# Patient Record
Sex: Female | Born: 1990
Health system: Southern US, Community
[De-identification: ages and names within clinical notes are randomized; demographics above are authoritative.]

## PROBLEM LIST (undated history)

## (undated) HISTORY — PX: NO PAST SURGERIES: SHX2092

---
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
## 2017-12-20 ENCOUNTER — Emergency Department (HOSPITAL_COMMUNITY)
Admission: EM | Admit: 2017-12-20 | Discharge: 2017-12-20 | Disposition: A | Discharge status: Home / Self Care | Payer: Self-pay | Attending: Emergency Medicine | Admitting: Emergency Medicine

## 2017-12-20 ENCOUNTER — Encounter: Payer: Self-pay | Admitting: Emergency Medicine

## 2017-12-20 ENCOUNTER — Emergency Department (HOSPITAL_COMMUNITY): Payer: Self-pay

## 2017-12-20 DIAGNOSIS — R1013 Epigastric pain: Secondary | ICD-10-CM | POA: Insufficient documentation

## 2017-12-20 LAB — URINALYSIS, ROUTINE W REFLEX MICROSCOPIC
Bilirubin Urine: NEGATIVE
GLUCOSE, UA: NEGATIVE mg/dL
KETONES UR: NEGATIVE mg/dL
Leukocytes, UA: NEGATIVE
Nitrite: NEGATIVE
PH: 8 (ref 5.0–8.0)
Protein, ur: NEGATIVE mg/dL
SPECIFIC GRAVITY, URINE: 1.014 (ref 1.005–1.030)

## 2017-12-20 LAB — CBC
HCT: 40.5 % (ref 36.0–46.0)
Hemoglobin: 13.5 g/dL (ref 12.0–15.0)
MCH: 29 pg (ref 26.0–34.0)
MCHC: 33.3 g/dL (ref 30.0–36.0)
MCV: 87.1 fL (ref 78.0–100.0)
PLATELETS: 231 10*3/uL (ref 150–400)
RBC: 4.65 MIL/uL (ref 3.87–5.11)
RDW: 13 % (ref 11.5–15.5)
WBC: 6.1 10*3/uL (ref 4.0–10.5)

## 2017-12-20 LAB — COMPREHENSIVE METABOLIC PANEL
ALT: 19 U/L (ref 14–54)
AST: 28 U/L (ref 15–41)
Albumin: 4.1 g/dL (ref 3.5–5.0)
Alkaline Phosphatase: 37 U/L — ABNORMAL LOW (ref 38–126)
Anion gap: 7 (ref 5–15)
BUN: 10 mg/dL (ref 6–20)
CHLORIDE: 107 mmol/L (ref 101–111)
CO2: 27 mmol/L (ref 22–32)
CREATININE: 0.84 mg/dL (ref 0.44–1.00)
Calcium: 9.1 mg/dL (ref 8.9–10.3)
GFR calc non Af Amer: >60 mL/min (ref >60)
Glucose, Bld: 89 mg/dL (ref 65–99)
Potassium: 4.1 mmol/L (ref 3.5–5.1)
SODIUM: 141 mmol/L (ref 135–145)
Total Bilirubin: 0.3 mg/dL (ref 0.3–1.2)
Total Protein: 7.9 g/dL (ref 6.5–8.1)

## 2017-12-20 LAB — POC URINE PREG, ED: Preg Test, Ur: NEGATIVE

## 2017-12-20 LAB — LIPASE, BLOOD: LIPASE: 41 U/L (ref 11–51)

## 2017-12-20 MED ORDER — SODIUM CHLORIDE 0.9 % IV BOLUS
1000.0000 mL | Freq: Once | INTRAVENOUS | Status: AC
  Administered 2017-12-20: 1000 mL via INTRAVENOUS

## 2017-12-20 NOTE — ED Provider Notes (Signed)
[facility]-EMERGENCY DEPT Provider Note   CSN: [id number] Arrival date & time: 12/20/17  1150     History   Chief Complaint Chief Complaint  Patient presents with  . Abdominal Pain    HPI Madison Holland is a 27 y.o. female.  HPI 27 year old female who has had epigastric pain intermittently that began on Friday morning when she woke up.  She had eaten hamburger, fries, and milkshake prior to going to bed the night before.  She has had intermittent pain since that time.  She has had some nausea but no vomiting.  Does appear worse with some p.o. intake especially higher fat food.  She was seen yesterday at urgent care and had a urinalysis.  She was told she had a urinary tract infection but has not had her medication filled at this time.  She states that the pain was worse yesterday.  It eased through the evening.  She is not having any significant pain currently.  She has not vomited has been able to take fluids without difficulty.  She is not sexually active and currently menstruating.  She denies any previous sexual activity. No past medical history on file.  There are no active problems to display for this patient.    OB History   None      Home Medications    Prior to Admission medications   Not on File    Family History No family history on file.  Social History Social History   Tobacco Use  . Smoking status: Not on file  Substance Use Topics  . Alcohol use: Not on file  . Drug use: Not on file     Allergies   Patient has no allergy information on record.   Review of Systems Review of Systems  All other systems reviewed and are negative.    Physical Exam Updated Vital Signs BP 113/70 (BP Location: Left Arm)   Pulse 86   Temp 98.1 F (36.7 C) (Oral)   Resp 16   LMP 12/18/2017   SpO2 100%   Physical Exam  Constitutional: She is oriented to person, place, and time. She appears well-developed and well-nourished.  HENT:   Head: Normocephalic and atraumatic.  Mouth/Throat: Oropharynx is clear and moist.  Eyes: Pupils are equal, round, and reactive to light. EOM are normal.  Cardiovascular: Normal rate and regular rhythm.  Abdominal: Soft. Normal appearance, normal aorta and bowel sounds are normal. There is no hepatosplenomegaly or splenomegaly. There is tenderness in the epigastric area. There is no rigidity, no rebound, no tenderness at McBurney's point and negative Murphy's sign.  Neurological: She is alert and oriented to person, place, and time.  Skin: Skin is warm and dry. Capillary refill takes less than 2 seconds.  Psychiatric: She has a normal mood and affect. Her behavior is normal.  Nursing note and vitals reviewed.    ED Treatments / Results  Labs (all labs ordered are listed, but only abnormal results are displayed) Labs Reviewed  COMPREHENSIVE METABOLIC PANEL - Abnormal; Notable for the following components:      Result Value   Alkaline Phosphatase 37 (*)    All other components within normal limits  URINALYSIS, ROUTINE W REFLEX MICROSCOPIC - Abnormal; Notable for the following components:   Hgb urine dipstick SMALL (*)    Bacteria, UA RARE (*)    Squamous Epithelial / LPF 0-5 (*)    All other components within normal limits  LIPASE, BLOOD  CBC  CBC WITH DIFFERENTIAL/PLATELET  POC URINE PREG, ED  POC URINE PREG, ED    EKG None  Radiology No results found.  Procedures Procedures (including critical care time)  Medications Ordered in ED Medications  sodium chloride 0.9 % bolus 1,000 mL (has no administration in time range)     Initial Impression / Assessment and Plan / ED Course  I have reviewed the triage vital signs and the nursing notes.  Pertinent labs & imaging results that were available during my care of the patient were reviewed by me and considered in my medical decision making (see chart for details).     Patient with epigastric pain which worsens with fatty  foods but no vomiting.  Here her labs are normal.  Initially ordered an ultrasound but has been taking some extended period of time.  I discussed alternatives of following up as an outpatient and patient wishes to do this.  She is advised regarding types of food to eat.  She is tolerating liquids without vomiting.  She is not febrile. Final Clinical Impressions(s) / ED Diagnoses   Final diagnoses:  Epigastric pain    ED Discharge Orders    None       Margarita Grizzle, MD 12/20/17 1424

## 2017-12-20 NOTE — ED Triage Notes (Signed)
Pt c/o LUQ pain since Friday morning with nausea and worsening at night. Radiation up through the chest and gets worse with eating. Has GI hx according to pt.

## 2017-12-21 ENCOUNTER — Encounter: Payer: Self-pay | Admitting: Physician Assistant

## 2018-01-01 ENCOUNTER — Ambulatory Visit: Payer: Self-pay | Admitting: Physician Assistant

## 2018-01-01 ENCOUNTER — Encounter: Payer: Self-pay | Admitting: Physician Assistant

## 2018-01-01 VITALS — BP 116/64 | HR 82 | Ht 61.0 in | Wt 154.0 lb

## 2018-01-01 DIAGNOSIS — R1011 Right upper quadrant pain: Secondary | ICD-10-CM

## 2018-01-01 DIAGNOSIS — R1013 Epigastric pain: Secondary | ICD-10-CM

## 2018-01-01 DIAGNOSIS — R110 Nausea: Secondary | ICD-10-CM

## 2018-01-01 NOTE — Patient Instructions (Signed)
If you are age 25 or older, your body mass index should be between 23-30. Your Body mass index is 29.1 kg/m. If this is out of the aforementioned range listed, please consider follow up with your Primary Care Provider.  If you are age 25 or younger, your body mass index should be between 19-25. Your Body mass index is 29.1 kg/m. If this is out of the aformentioned range listed, please consider follow up with your Primary Care Provider.   You have been scheduled for an abdominal ultrasound at [facility] (1st floor of hospital) on 01/07/18 at 10:30 am. Please arrive 15 minutes prior to your appointment for registration. Make certain not to have anything to eat or drink after midnight the day prior to your appointment. Should you need to reschedule your appointment, please contact radiology at [phone]. This test typically takes about 30 minutes to perform.  Stay on low fat diet for a month total, then have fatty foods in moderation. If you have pain again after 1 month, start Prilosec 20 mg daily for two weeks (one box). If continues, please call.  Thank you for choosing me and [facility].   Hyacinth MeekerJennifer Lemmon,  PA-C

## 2018-01-01 NOTE — Progress Notes (Signed)
Reviewed and agree with documentation and assessment and plan. K. Veena Nandigam , MD   

## 2018-01-01 NOTE — Progress Notes (Signed)
Chief Complaint: Epigastric pain  HPI:    Madison Holland is a 27 year old female who presents to clinic today, accompanied by her mother who assists with history, as a referral from Dr. Rosalia Hammersay in the emergency room for epigastric pain.    12/20/17 ED visit with epigastric pain intermittently.  CMP, urinalysis, lipase, CBC and urine pregnancy test were all negative/normal.    Today, explains she has a history of epigastric cramping related to fatty foods that she eats in the past, but most recently on 12/19/17 she developed severe 10/10 epigastric pain which left her in the bed, associated with nausea, and lasted all day and into the next morning when she proceeded to the ER.  Describes eating a very large fatty meal including a cheeseburger, tater tots covered in cheese and alcoholic milk shake the night before.  Also woke up that morning and took a large amount of pain medicine related to abdominal cramps.  Since being seen in the ER patient has been on a low-fat diet and has been able to avoid any epigastric/right upper quadrant pain at all.  She has had no further problems over the past 2 weeks.   Does describe a history of lactose intolerance with loose stools after dairy products.    Denies fever, chills, blood in her stool, melena, weight loss, anorexia, vomiting, continued abdominal pain, change in bowel habits or symptoms that awaken her at night.  Past Medical/Surgical History: None  Allergies: None  Family History  Problem Relation Age of Onset  . Irritable bowel syndrome Mother     Social History   Socioeconomic History  . Marital status: Single    Spouse name: Not on file  . Number of children: Not on file  . Years of education: Not on file  . Highest education level: Not on file  Occupational History  . Occupation: IT sales professionalales Associate  Social Needs  . Financial resource strain: Not on file  . Food insecurity:    Worry: Not on file    Inability: Not on file  . Transportation  needs:    Medical: Not on file    Non-medical: Not on file  Tobacco Use  . Smoking status: Never Smoker  . Smokeless tobacco: Never Used  Substance and Sexual Activity  . Alcohol use: Yes    Comment: Occasional  . Drug use: Never  . Sexual activity: Not on file  Lifestyle  . Physical activity:    Days per week: Not on file    Minutes per session: Not on file  . Stress: Not on file  Relationships  . Social connections:    Talks on phone: Not on file    Gets together: Not on file    Attends religious service: Not on file    Active member of club or organization: Not on file    Attends meetings of clubs or organizations: Not on file    Relationship status: Not on file  . Intimate partner violence:    Fear of current or ex partner: Not on file    Emotionally abused: Not on file    Physically abused: Not on file    Forced sexual activity: Not on file  Other Topics Concern  . Not on file  Social History Narrative  . Not on file    Review of Systems:    Constitutional: No weight loss, fever or chills Skin: No rash Cardiovascular: No chest pain Respiratory: No SOB  Gastrointestinal: See HPI and otherwise  negative Genitourinary: No dysuria  Neurological: No headache, dizziness or syncope Musculoskeletal: No new muscle or joint pain Hematologic: No bleeding  Psychiatric: No history of depression or anxiety   Physical Exam:  Vital signs: BP 116/64   Pulse 82   Ht 5\' 1"  (1.549 m)   Wt 154 lb (69.9 kg)   LMP 12/18/2017   BMI 29.10 kg/m   Constitutional:   Very Pleasant Caucasian female appears to be in NAD, Well developed, Well nourished, alert and cooperative Head:  Normocephalic and atraumatic. Eyes:   PEERL, EOMI. No icterus. Conjunctiva pink. Ears:  Normal auditory acuity. Neck:  Supple Throat: Oral cavity and pharynx without inflammation, swelling or lesion.  Respiratory: Respirations even and unlabored. Lungs clear to auscultation bilaterally.   No wheezes,  crackles, or rhonchi.  Cardiovascular: Normal S1, S2. No MRG. Regular rate and rhythm. No peripheral edema, cyanosis or pallor.  Gastrointestinal:  Soft, nondistended, nontender. No rebound or guarding. Normal bowel sounds. No appreciable masses or hepatomegaly. Rectal:  Not performed.  Msk:  Symmetrical without gross deformities. Without edema, no deformity or joint abnormality.  Neurologic:  Alert and  oriented x4;  grossly normal neurologically.  Skin:   Dry and intact without significant lesions or rashes. Psychiatric:  Demonstrates good judgement and reason without abnormal affect or behaviors.  RELEVANT LABS AND IMAGING: CBC    Component Value Date/Time   WBC 6.1 12/20/2017 1214   RBC 4.65 12/20/2017 1214   HGB 13.5 12/20/2017 1214   HCT 40.5 12/20/2017 1214   PLT 231 12/20/2017 1214   MCV 87.1 12/20/2017 1214   MCH 29.0 12/20/2017 1214   MCHC 33.3 12/20/2017 1214   RDW 13.0 12/20/2017 1214    CMP     Component Value Date/Time   NA 141 12/20/2017 1214   K 4.1 12/20/2017 1214   CL 107 12/20/2017 1214   CO2 27 12/20/2017 1214   GLUCOSE 89 12/20/2017 1214   BUN 10 12/20/2017 1214   CREATININE 0.84 12/20/2017 1214   CALCIUM 9.1 12/20/2017 1214   PROT 7.9 12/20/2017 1214   ALBUMIN 4.1 12/20/2017 1214   AST 28 12/20/2017 1214   ALT 19 12/20/2017 1214   ALKPHOS 37 (L) 12/20/2017 1214   BILITOT 0.3 12/20/2017 1214   GFRNONAA >60 12/20/2017 1214   GFRAA >60 12/20/2017 1214    Assessment: 1.  Epigastric/right upper quadrant pain: Seen in ER, normal CBC, CMP and lipase, typically after high fat meal; consider gastritis vs gallbladder etiology 2.  Nausea: With above  Plan: 1.  Ordered right upper quadrant ultrasound to consider gallbladder etiology.  Discussed with patient that if this is normal there is no need for further workup at this time if she is not having any more symptoms.  If this does show gallstones we will discuss. 2.  Recommend patient remain on a low-fat  diet over the next month.  She may then trial a small fatty meal to see if she can handle this.  Would recommend she eat fatty meals in moderation in the future.  If she has further abdominal pain over that point would recommend she start Prilosec 20 mg daily times 2 weeks OTC.  If after that she continues with further pain would recommend she call her clinic. 3.  Reviewed antireflux diet and lifestyle modifications. 4.  Patient to follow in clinic as needed with me in the future, she was assigned to Dr. Lavon Paganini today.  Hyacinth Meeker, PA-C [facility] 01/01/2018, 2:40 PM

## 2018-01-07 ENCOUNTER — Ambulatory Visit (HOSPITAL_COMMUNITY)
Admission: RE | Admit: 2018-01-07 | Discharge: 2018-01-07 | Disposition: A | Discharge status: Home / Self Care | Payer: Self-pay | Source: Ambulatory Visit | Attending: Physician Assistant | Admitting: Physician Assistant

## 2018-01-07 DIAGNOSIS — R110 Nausea: Secondary | ICD-10-CM

## 2018-01-07 DIAGNOSIS — R1011 Right upper quadrant pain: Secondary | ICD-10-CM

## 2018-01-07 DIAGNOSIS — K8020 Calculus of gallbladder without cholecystitis without obstruction: Secondary | ICD-10-CM | POA: Insufficient documentation

## 2019-02-03 ENCOUNTER — Other Ambulatory Visit: Payer: Self-pay

## 2019-02-03 ENCOUNTER — Ambulatory Visit (INDEPENDENT_AMBULATORY_CARE_PROVIDER_SITE_OTHER): Payer: 59 | Admitting: Gastroenterology

## 2019-02-03 ENCOUNTER — Encounter: Payer: Self-pay | Admitting: Gastroenterology

## 2019-02-03 VITALS — Ht 61.0 in | Wt 154.0 lb

## 2019-02-03 DIAGNOSIS — R110 Nausea: Secondary | ICD-10-CM

## 2019-02-03 DIAGNOSIS — R140 Abdominal distension (gaseous): Secondary | ICD-10-CM

## 2019-02-03 DIAGNOSIS — R1011 Right upper quadrant pain: Secondary | ICD-10-CM

## 2019-02-03 MED ORDER — OMEPRAZOLE 20 MG PO CPDR: 20.0000 mg | DELAYED_RELEASE_CAPSULE | Freq: Every day | ORAL | 3 refills | Status: DC

## 2019-02-03 NOTE — Patient Instructions (Addendum)
HIDA scan,  You have been scheduled for a HIDA scan at [facility] (1st floor) on 02/07/2019. Please arrive 15 minutes prior to your scheduled appointment at  9:60AV. Make certain not to have anything to eat or drink at least after midnight prior to your test. Should this appointment date or time not work well for you, please call radiology scheduling at [phone].  _____________________________________________________________________ hepatobiliary (HIDA) scan is an imaging procedure used to diagnose problems in the liver, gallbladder and bile ducts. In the HIDA scan, a radioactive chemical or tracer is injected into a vein in your arm. The tracer is handled by the liver like bile. Bile is a fluid produced and excreted by your liver that helps your digestive system break down fats in the foods you eat. Bile is stored in your gallbladder and the gallbladder releases the bile when you eat a meal. A special nuclear medicine scanner (gamma camera) tracks the flow of the tracer from your liver into your gallbladder and small intestine.  During your HIDA scan  You'll be asked to change into a hospital gown before your HIDA scan begins. Your health care team will position you on a table, usually on your back. The radioactive tracer is then injected into a vein in your arm.The tracer travels through your bloodstream to your liver, where it's taken up by the bile-producing cells. The radioactive tracer travels with the bile from your liver into your gallbladder and through your bile ducts to your small intestine.You may feel some pressure while the radioactive tracer is injected into your vein. As you lie on the table, a special gamma camera is positioned over your abdomen taking pictures of the tracer as it moves through your body. The gamma camera takes pictures continually for about an hour. You'll need to keep still during the HIDA scan. This can become uncomfortable, but you may find that you can lessen  the discomfort by taking deep breaths and thinking about other things. Tell your health care team if you're uncomfortable. The radiologist will watch on a computer the progress of the radioactive tracer through your body. The HIDA scan may be stopped when the radioactive tracer is seen in the gallbladder and enters your small intestine. This typically takes about an hour. In some cases extra imaging will be performed if original images aren't satisfactory, if morphine is given to help visualize the gallbladder or if the medication CCK is given to look at the contraction of the gallbladder. This test typically takes 2 hours to complete. ________________________________________________________________________  Omeprazole  daily, 30 minutes before breakfast  Anti reflux measures  Follow up visit virtual in 2-4 weeks   Gastroesophageal Reflux Disease, Adult Gastroesophageal reflux (GER) happens when acid from the stomach flows up into the tube that connects the mouth and the stomach (esophagus). Normally, food travels down the esophagus and stays in the stomach to be digested. However, when a person has GER, food and stomach acid sometimes move back up into the esophagus. If this becomes a more serious problem, the person may be diagnosed with a disease called gastroesophageal reflux disease (GERD). GERD occurs when the reflux:  Happens often.  Causes frequent or severe symptoms.  Causes problems such as damage to the esophagus. When stomach acid comes in contact with the esophagus, the acid may cause soreness (inflammation) in the esophagus. Over time, GERD may create small holes (ulcers) in the lining of the esophagus. What are the causes? This condition is caused by a  problem with the muscle between the esophagus and the stomach (lower esophageal sphincter, or LES). Normally, the LES muscle closes after food passes through the esophagus to the stomach. When the LES is weakened or abnormal, it  does not close properly, and that allows food and stomach acid to go back up into the esophagus. The LES can be weakened by certain dietary substances, medicines, and medical conditions, including:  Tobacco use.  Pregnancy.  Having a hiatal hernia.  Alcohol use.  Certain foods and beverages, such as coffee, chocolate, onions, and peppermint. What increases the risk? You are more likely to develop this condition if you:  Have an increased body weight.  Have a connective tissue disorder.  Use NSAID medicines. What are the signs or symptoms? Symptoms of this condition include:  Heartburn.  Difficult or painful swallowing.  The feeling of having a lump in the throat.  Abitter taste in the mouth.  Bad breath.  Having a large amount of saliva.  Having an upset or bloated stomach.  Belching.  Chest pain. Different conditions can cause chest pain. Make sure you see your health care provider if you experience chest pain.  Shortness of breath or wheezing.  Ongoing (chronic) cough or a night-time cough.  Wearing away of tooth enamel.  Weight loss. How is this diagnosed? Your health care provider will take a medical history and perform a physical exam. To determine if you have mild or severe GERD, your health care provider may also monitor how you respond to treatment. You may also have tests, including:  A test to examine your stomach and esophagus with a small camera (endoscopy).  A test thatmeasures the acidity level in your esophagus.  A test thatmeasures how much pressure is on your esophagus.  A barium swallow or modified barium swallow test to show the shape, size, and functioning of your esophagus. How is this treated? The goal of treatment is to help relieve your symptoms and to prevent complications. Treatment for this condition may vary depending on how severe your symptoms are. Your health care provider may recommend:  Changes to your  diet.  Medicine.  Surgery. Follow these instructions at home: Eating and drinking   Follow a diet as recommended by your health care provider. This may involve avoiding foods and drinks such as: ? Coffee and tea (with or without caffeine). ? Drinks that containalcohol. ? Energy drinks and sports drinks. ? Carbonated drinks or sodas. ? Chocolate and cocoa. ? Peppermint and mint flavorings. ? Garlic and onions. ? Horseradish. ? Spicy and acidic foods, including peppers, chili powder, curry powder, vinegar, hot sauces, and barbecue sauce. ? Citrus fruit juices and citrus fruits, such as oranges, lemons, and limes. ? Tomato-based foods, such as red sauce, chili, salsa, and pizza with red sauce. ? Fried and fatty foods, such as donuts, french fries, potato chips, and high-fat dressings. ? High-fat meats, such as hot dogs and fatty cuts of red and white meats, such as rib eye steak, sausage, ham, and bacon. ? High-fat dairy items, such as whole milk, butter, and cream cheese.  Eat small, frequent meals instead of large meals.  Avoid drinking large amounts of liquid with your meals.  Avoid eating meals during the 2-3 hours before bedtime.  Avoid lying down right after you eat.  Do not exercise right after you eat. Lifestyle   Do not use any products that contain nicotine or tobacco, such as cigarettes, e-cigarettes, and chewing tobacco. If you need help  quitting, ask your health care provider.  Try to reduce your stress by using methods such as yoga or meditation. If you need help reducing stress, ask your health care provider.  If you are overweight, reduce your weight to an amount that is healthy for you. Ask your health care provider for guidance about a safe weight loss goal. General instructions  Pay attention to any changes in your symptoms.  Take over-the-counter and prescription medicines only as told by your health care provider. Do not take aspirin, ibuprofen, or  other NSAIDs unless your health care provider told you to do so.  Wear loose-fitting clothing. Do not wear anything tight around your waist that causes pressure on your abdomen.  Raise (elevate) the head of your bed about 6 inches (15 cm).  Avoid bending over if this makes your symptoms worse.  Keep all follow-up visits as told by your health care provider. This is important. Contact a health care provider if:  You have: ? New symptoms. ? Unexplained weight loss. ? Difficulty swallowing or it hurts to swallow. ? Wheezing or a persistent cough. ? A hoarse voice.  Your symptoms do not improve with treatment. Get help right away if you:  Have pain in your arms, neck, jaw, teeth, or back.  Feel sweaty, dizzy, or light-headed.  Have chest pain or shortness of breath.  Vomit and your vomit looks like blood or coffee grounds.  Faint.  Have stool that is bloody or black.  Cannot swallow, drink, or eat. Summary  Gastroesophageal reflux happens when acid from the stomach flows up into the esophagus. GERD is a disease in which the reflux happens often, causes frequent or severe symptoms, or causes problems such as damage to the esophagus.  Treatment for this condition may vary depending on how severe your symptoms are. Your health care provider may recommend diet and lifestyle changes, medicine, or surgery.  Contact a health care provider if you have new or worsening symptoms.  Take over-the-counter and prescription medicines only as told by your health care provider. Do not take aspirin, ibuprofen, or other NSAIDs unless your health care provider told you to do so.  Keep all follow-up visits as told by your health care provider. This is important. This information is not intended to replace advice given to you by your health care provider. Make sure you discuss any questions you have with your health care provider. Document Released: 06/18/2005 Document Revised: 03/17/2018  Document Reviewed: 03/17/2018 Elsevier Interactive Patient Education  2019 [employer].   I appreciate the  opportunity to care for you  Thank You   Marsa Aris , MD

## 2019-02-03 NOTE — Progress Notes (Signed)
Madison Holland    [id number]    07/24/1991  Primary Care Physician:Patient, No Pcp Per  Referring Physician: No referring provider defined for this encounter.  This service was provided via audio and video telemedicine (Doximity) due to COVID 19 pandemic.  Patient location: Home Provider location: Office Used 2 patient identifiers to confirm the correct person. Explained the limitations in evaluation and management via telemedicine. Patient is aware of potential medical charges for this visit.  Patient consented to this virtual visit.  The persons participating in this telemedicine service were myself and the patient   Chief complaint: Nausea, abdominal bloating, upper abdominal discomfort HPI:  28 year old female with complaints of intermittent nausea and epigastric abdominal discomfort.  She saw Salome ArntJennifer lemon in April 2019. Right upper quadrant abdominal ultrasound January 07, 2018 showed cholelithiasis with largest stone 1.2 cm, no gallbladder wall thickening or pericholecystic fluid.  Normal CBD with no intra-or extrahepatic biliary duct dilation. Has been having nausea almost every day for past few weeks associated with bloating and epigastric abdominal discomfort, worse after meals sometimes. Her bowel habits are slightly worse, irregular with occasional constipation.  Most days has daily bowel movement.  No blood per rectum.  No loss of appetite or weight loss.   No outpatient encounter medications on file as of 02/03/2019.   No facility-administered encounter medications on file as of 02/03/2019.     Allergies as of 02/03/2019  . (Not on File)    History reviewed. No pertinent past medical history.  History reviewed. No pertinent surgical history.  Family History  Problem Relation Age of Onset  . Irritable bowel syndrome Mother     Social History   Socioeconomic History  . Marital status: Single    Spouse name: Not on file  . Number of  children: Not on file  . Years of education: Not on file  . Highest education level: Not on file  Occupational History  . Occupation: IT sales professionalales Associate  Social Needs  . Financial resource strain: Not on file  . Food insecurity:    Worry: Not on file    Inability: Not on file  . Transportation needs:    Medical: Not on file    Non-medical: Not on file  Tobacco Use  . Smoking status: Never Smoker  . Smokeless tobacco: Never Used  Substance and Sexual Activity  . Alcohol use: Yes    Comment: Occasional  . Drug use: Never  . Sexual activity: Not on file  Lifestyle  . Physical activity:    Days per week: Not on file    Minutes per session: Not on file  . Stress: Not on file  Relationships  . Social connections:    Talks on phone: Not on file    Gets together: Not on file    Attends religious service: Not on file    Active member of club or organization: Not on file    Attends meetings of clubs or organizations: Not on file    Relationship status: Not on file  . Intimate partner violence:    Fear of current or ex partner: Not on file    Emotionally abused: Not on file    Physically abused: Not on file    Forced sexual activity: Not on file  Other Topics Concern  . Not on file  Social History Narrative  . Not on file      Review of systems: Review of  Systems as per HPI All other systems reviewed and are negative.   Physical Exam: Vitals were not taken and physical exam was not performed during this virtual visit.  Data Reviewed:  Reviewed labs, radiology imaging, old records and pertinent past GI work up   Assessment and Plan/Recommendations: 28 year old female with complaints of epigastric abdominal discomfort, nausea and bloating. Had cholelithiasis with largest stone 1.2 cm in size based on ultrasound April 2019 Her symptoms are concerning for gallbladder disease, check HIDA scan  Start omeprazole 20 mg daily, 30 minutes before breakfast for possible GERD  exacerbating her symptoms Anti-reflux measures  Follow-up telemedicine visit in 2 to 4 weeks   K. Scherry Ran , MD   CC: No ref. provider found

## 2019-02-07 ENCOUNTER — Other Ambulatory Visit: Payer: Self-pay

## 2019-02-07 ENCOUNTER — Ambulatory Visit (HOSPITAL_COMMUNITY)
Admission: RE | Admit: 2019-02-07 | Discharge: 2019-02-07 | Disposition: A | Discharge status: Home / Self Care | Payer: BLUE CROSS/BLUE SHIELD | Source: Ambulatory Visit | Attending: Gastroenterology | Admitting: Gastroenterology

## 2019-02-07 DIAGNOSIS — R140 Abdominal distension (gaseous): Secondary | ICD-10-CM

## 2019-02-07 DIAGNOSIS — R110 Nausea: Secondary | ICD-10-CM | POA: Insufficient documentation

## 2019-02-07 DIAGNOSIS — R1011 Right upper quadrant pain: Secondary | ICD-10-CM | POA: Insufficient documentation

## 2019-02-07 DIAGNOSIS — R109 Unspecified abdominal pain: Secondary | ICD-10-CM | POA: Diagnosis not present

## 2019-02-07 MED ORDER — TECHNETIUM TC 99M MEBROFENIN IV KIT
5.0000 | PACK | Freq: Once | INTRAVENOUS | Status: AC | PRN
  Administered 2019-02-07: 5 via INTRAVENOUS

## 2019-03-14 ENCOUNTER — Ambulatory Visit (INDEPENDENT_AMBULATORY_CARE_PROVIDER_SITE_OTHER): Payer: Self-pay | Admitting: Gastroenterology

## 2019-03-14 DIAGNOSIS — R1013 Epigastric pain: Secondary | ICD-10-CM

## 2019-03-14 NOTE — Progress Notes (Signed)
Patient said she is feeling well, does not recall making an appointment and did not feel she needs a visit at this point.  She will call if needed.

## 2019-08-06 ENCOUNTER — Encounter (HOSPITAL_COMMUNITY): Payer: Self-pay

## 2019-08-06 ENCOUNTER — Ambulatory Visit (HOSPITAL_COMMUNITY)
Admission: EM | Admit: 2019-08-06 | Discharge: 2019-08-06 | Disposition: A | Discharge status: Home / Self Care | Payer: 59 | Attending: Internal Medicine | Admitting: Internal Medicine

## 2019-08-06 ENCOUNTER — Other Ambulatory Visit: Payer: Self-pay

## 2019-08-06 DIAGNOSIS — J040 Acute laryngitis: Secondary | ICD-10-CM

## 2019-08-06 NOTE — ED Provider Notes (Signed)
[place]    CSN: [id number] Arrival date & time: 08/06/19  1337      History   Chief Complaint Chief Complaint  Patient presents with  . Laryngitis  . Fatigue    HPI Madison Holland is a 28 y.o. female.   28 year old female with no chronic medical problems presents to urgent care complaining of laryngitis x2 days.  Patient denies fever, sore throat, nausea, vomiting, diarrhea or reflux.  She also denies headache or loss of taste or smell.     History reviewed. No pertinent past medical history.  There are no active problems to display for this patient.   History reviewed. No pertinent surgical history.  OB History   No obstetric history on file.      Home Medications    Prior to Admission medications   Medication Sig Start Date End Date Taking? Authorizing Provider  omeprazole (PRILOSEC) 20 MG capsule Take 1 capsule (20 mg total) by mouth daily. 02/03/19   Mauri Pole, MD    Family History Family History  Problem Relation Age of Onset  . Irritable bowel syndrome Mother     Social History Social History   Tobacco Use  . Smoking status: Never Smoker  . Smokeless tobacco: Never Used  Substance Use Topics  . Alcohol use: Yes    Comment: Occasional  . Drug use: Never     Allergies   Patient has no known allergies.   Review of Systems Review of Systems  Constitutional: Negative for chills and fever.  HENT: Negative for sore throat and tinnitus.   Eyes: Negative for redness.  Respiratory: Negative for cough and shortness of breath.   Cardiovascular: Negative for chest pain and palpitations.  Gastrointestinal: Negative for abdominal pain, diarrhea, nausea and vomiting.  Genitourinary: Negative for dysuria, frequency and urgency.  Musculoskeletal: Negative for myalgias.  Skin: Negative for rash.       No lesions  Neurological: Negative for weakness.  Hematological: Does not bruise/bleed easily.  Psychiatric/Behavioral:  Negative for suicidal ideas.     Physical Exam Triage Vital Signs ED Triage Vitals [08/06/19 1404]  Enc Vitals Group     BP (!) 115/50     Pulse Rate 70     Resp 16     Temp 98.2 F (36.8 C)     Temp Source Oral     SpO2 100 %     Weight      Height      Head Circumference      Peak Flow      Pain Score 0     Pain Loc      Pain Edu?      Excl. in [place]?    No data found.  Updated Vital Signs BP (!) 115/50 (BP Location: Left Arm)   Pulse 70   Temp 98.2 F (36.8 C) (Oral)   Resp 16   LMP 07/09/2019   SpO2 100%   Visual Acuity Right Eye Distance:   Left Eye Distance:   Bilateral Distance:    Right Eye Near:   Left Eye Near:    Bilateral Near:     Physical Exam Vitals signs and nursing note reviewed.  Constitutional:      General: She is not in acute distress.    Appearance: She is well-developed.  HENT:     Head: Normocephalic and atraumatic.     Mouth/Throat:     Comments: Mild cobblestoning posterior oropharynx Eyes:  General: No scleral icterus.    Conjunctiva/sclera: Conjunctivae normal.     Pupils: Pupils are equal, round, and reactive to light.  Neck:     Musculoskeletal: Normal range of motion and neck supple.     Thyroid: No thyromegaly.     Vascular: No JVD.     Trachea: No tracheal deviation.  Cardiovascular:     Rate and Rhythm: Normal rate and regular rhythm.     Heart sounds: Normal heart sounds. No murmur. No friction rub. No gallop.   Pulmonary:     Effort: Pulmonary effort is normal.     Breath sounds: Normal breath sounds.  Abdominal:     General: Bowel sounds are normal. There is no distension.     Palpations: Abdomen is soft.     Tenderness: There is no abdominal tenderness.  Musculoskeletal: Normal range of motion.  Lymphadenopathy:     Cervical: No cervical adenopathy.  Skin:    General: Skin is warm and dry.  Neurological:     Mental Status: She is alert and oriented to person, place, and time.     Cranial Nerves: No  cranial nerve deficit.  Psychiatric:        Behavior: Behavior normal.        Thought Content: Thought content normal.        Judgment: Judgment normal.      UC Treatments / Results  Labs (all labs ordered are listed, but only abnormal results are displayed) Labs Reviewed - No data to display  EKG   Radiology No results found.  Procedures Procedures (including critical care time)  Medications Ordered in UC Medications - No data to display  Initial Impression / Assessment and Plan / UC Course  I have reviewed the triage vital signs and the nursing notes.  Pertinent labs & imaging results that were available during my care of the patient were reviewed by me and considered in my medical decision making (see chart for details).     Long discussion with patient regarding bacterial versus viral etiology of laryngitis.  Also discussed risk factors such as vocal straining, smoking, reflux.  The patient does not endorse any of these issues.  She would rather not take antibiotics.  Thus recommended vocal rest. Final Clinical Impressions(s) / UC Diagnoses   Final diagnoses:  Laryngitis   Discharge Instructions   None    ED Prescriptions    None     PDMP not reviewed this encounter.   Arnaldo Natal, MD 08/06/19 [id number]

## 2019-08-06 NOTE — ED Triage Notes (Addendum)
Pt  Present loss voice and fatigue. Symptoms started two days ago.

## 2019-08-09 DIAGNOSIS — Z20828 Contact with and (suspected) exposure to other viral communicable diseases: Secondary | ICD-10-CM | POA: Diagnosis not present

## 2020-10-28 IMAGING — NM NUCLEAR MEDICINE HEPATOBILIARY IMAGING WITH GALLBLADDER EF
2 series · 12 of 12 positions shown · non-contrast
Comparison: Ultrasound right upper quadrant January 07, 2018

CLINICAL DATA: Abdominal pain and nausea. Cholelithiasis on prior
ultrasound

EXAM:
NUCLEAR MEDICINE HEPATOBILIARY IMAGING WITH GALLBLADDER EF
VIEWS:
Anterior right upper quadrant
RADIOPHARMACEUTICALS:  5.0 mCi Xc-RRm  Choletec IV

[he hepatobiliary · 4.52mm/px · 6 of 53 frames shown (1 of 2)]
[frame 5/53]
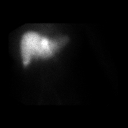
[frame 13/53]
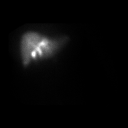
[frame 22/53]
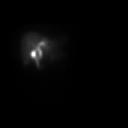
[frame 31/53]
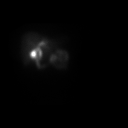
[frame 40/53]
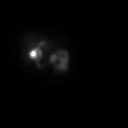
[frame 49/53]
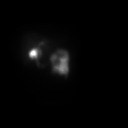

[he hepatobiliary · 4.52mm/px · 6 of 60 frames shown (2 of 2)]
[frame 6/60]
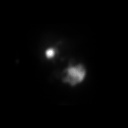
[frame 16/60]
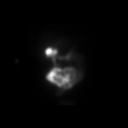
[frame 26/60]
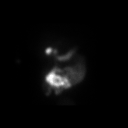
[frame 36/60]
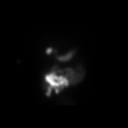
[frame 46/60]
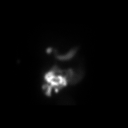
[frame 56/60]
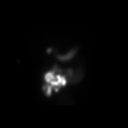

[12 of 12 positions shown; findings below may reference images not displayed]

FINDINGS: Liver uptake of radiotracer is unremarkable. There is prompt
visualization of gallbladder and small bowel, indicating patency of
the cystic and common bile ducts. The patient consumed 8 ounces of
Ensure Complete orally with calculation of the computer generated
ejection fraction of radiotracer from the gallbladder. The patient
did not experience clinical symptoms with the oral Ensure
consumption. The computer generated ejection fraction of radiotracer
from the gallbladder is normal at 89%, normal greater than 33% using
the oral agent.
IMPRESSION: Study within normal limits.

## 2021-01-28 ENCOUNTER — Encounter: Payer: Self-pay | Admitting: Physician Assistant

## 2021-01-28 ENCOUNTER — Ambulatory Visit: Payer: Self-pay | Admitting: Physician Assistant

## 2021-01-28 VITALS — BP 110/70 | HR 93 | Ht 61.0 in | Wt 166.0 lb

## 2021-01-28 DIAGNOSIS — K5900 Constipation, unspecified: Secondary | ICD-10-CM

## 2021-01-28 DIAGNOSIS — A041 Enterotoxigenic Escherichia coli infection: Secondary | ICD-10-CM

## 2021-01-28 NOTE — Patient Instructions (Signed)
If you are age 28 or older, your body mass index should be between 23-30. Your Body mass index is 31.37 kg/m. If this is out of the aforementioned range listed, please consider follow up with your Primary Care Provider.  If you are age 62 or younger, your body mass index should be between 19-25. Your Body mass index is 31.37 kg/m. If this is out of the aformentioned range listed, please consider follow up with your Primary Care Provider.   Please start Miralax once daily for 2 weeks.  Start Align once daily for 2 weeks.  Thank you for choosing me and [facility].  Hyacinth Meeker, PA-C

## 2021-01-28 NOTE — Progress Notes (Signed)
Chief Complaint: Constipation and diarrhea  HPI:    [name] is a 30 year old female with a past medical history as listed below, known to Dr. Lavon Paganini, who presents clinic today with a complaint of constipation and diarrhea.    02/03/2019 patient seen via telemedicine by Dr. Lavon Paganini for nausea, abdominal bloating upper abdominal discomfort.  That time he been having nausea almost every day for a few weeks with bloating and epigastric abdominal discomfort worse after meals.  It was noted that she had an ultrasound previously with cholelithiasis large stone 1.2 cm in size in April 2019.  HIDA scan was ordered and she was started on omeprazole 20 mg daily.    02/07/2019 HIDA scan within normal limits.    01/11/2021 GI pathogen panel with enteroaggregate today of E. coli.  And C. difficile toxin negative.    Today, the patient tells me that a couple of weeks ago she was diagnosed with E. coli.  Around that time had terrible diarrhea and could not eat or drink anything.  Tells me her resting heart rate went up to 98 and she ran a fever for 2 days.  This has since resolved over the past 2 weeks but now she is experiencing constipation, telling me she only has a bowel movement once every 3 days and is very bloated and uncomfortable.  She was using a probiotic during time of diarrhea but has since stopped this.  Has not tried any laxatives.  Prior to her E. coli she was having a bowel movement every day.    Denies continued fever or chills, blood in her stool or symptoms that awaken her from sleep.  Past medical/ Past Surgical History:  Procedure Laterality Date  . NO PAST SURGERIES      No current outpatient medications on file.   No current facility-administered medications for this visit.    Allergies as of 01/28/2021  . (No Known Allergies)    Family History  Problem Relation Age of Onset  . Irritable bowel syndrome Mother     Social History   Socioeconomic History  . Marital  status: Single    Spouse name: Not on file  . Number of children: Not on file  . Years of education: Not on file  . Highest education level: Not on file  Occupational History  . Occupation: IT sales professional  Tobacco Use  . Smoking status: Never Smoker  . Smokeless tobacco: Never Used  Vaping Use  . Vaping Use: Some days  Substance and Sexual Activity  . Alcohol use: Yes    Comment: Occasional  . Drug use: Never  . Sexual activity: Not on file  Other Topics Concern  . Not on file  Social History Narrative  . Not on file   Social Determinants of Health   Financial Resource Strain: Not on file  Food Insecurity: Not on file  Transportation Needs: Not on file  Physical Activity: Not on file  Stress: Not on file  Social Connections: Not on file  Intimate Partner Violence: Not on file    Review of Systems:    Constitutional: No weight loss Cardiovascular: No chest pain Respiratory: No SOB  Gastrointestinal: See HPI and otherwise negative   Physical Exam:  Vital signs: BP 110/70   Pulse 93   Ht 5\' 1"  (1.549 m)   Wt 166 lb (75.3 kg)   BMI 31.37 kg/m   Constitutional:   Pleasant overweight Caucasian female appears to be in NAD, Well developed, Well  nourished, alert and cooperative Respiratory: Respirations even and unlabored. Lungs clear to auscultation bilaterally.   No wheezes, crackles, or rhonchi.  Cardiovascular: Normal S1, S2. No MRG. Regular rate and rhythm. No peripheral edema, cyanosis or pallor.  Gastrointestinal:  Soft, nondistended, nontender. No rebound or guarding. Normal bowel sounds. No appreciable masses or hepatomegaly. Rectal:  Not performed.  Psychiatric: Demonstrates good judgement and reason without abnormal affect or behaviors.  See HPI for recent labs and imaging.  Assessment: 1.  E. coli diarrhea: Diagnosed on 4/22, no further diarrhea per the patient now 2.  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  Discussed  postinfectious IBS.  Likely this is what she is experiencing now. 2.  Recommend the patient start MiraLAX once daily for the next couple of weeks and then back off to see how she is doing. 3.  Encouraged the patient to get over-the-counter Align probiotic and use this once daily for the next 2 months 4.  Patient to follow in clinic with [place] as needed in the future.  Hyacinth Meeker, PA-C [facility] 01/28/2021, 9:05 AM

## 2021-02-08 NOTE — Progress Notes (Signed)
Reviewed and agree with documentation and assessment and plan. K. Veena Kahliyah Dick , MD   

## 2021-02-10 ENCOUNTER — Telehealth: Payer: Self-pay | Admitting: Family

## 2021-02-10 DIAGNOSIS — R399 Unspecified symptoms and signs involving the genitourinary system: Secondary | ICD-10-CM

## 2021-02-10 MED ORDER — CEPHALEXIN 500 MG PO CAPS: 500.0000 mg | ORAL_CAPSULE | Freq: Two times a day (BID) | ORAL | 0 refills | Status: AC

## 2021-02-10 NOTE — Progress Notes (Signed)

## 2021-02-21 ENCOUNTER — Telehealth: Payer: Self-pay | Admitting: Physician Assistant

## 2021-02-21 DIAGNOSIS — N390 Urinary tract infection, site not specified: Secondary | ICD-10-CM

## 2021-02-21 NOTE — Progress Notes (Signed)
Based on what you shared with me, I feel your condition warrants further evaluation and I recommend that you be seen in a face to face office visit. Giving recurrent UTI after recent treatment via e-visti, you need an in-person evaluation so that your urine can be tested and sent for culture. This was we can make sure the infection is properly treated and will not continue to recur.    NOTE: If you entered your credit card information for this eVisit, you will not be charged. You may see a "hold" on your card for the $35 but that hold will drop off and you will not have a charge processed.   If you are having a true medical emergency please call 911.      For an urgent face to face visit, [facility] has six urgent care centers for your convenience:     [facility] at [facility] Directions [phone] [address] [place], [place] [address] . 8 am - 4 pm Monday - Friday    [facility]) Get Driving Directions [phone] [address] [place], [place] [address] . 8 am to 8 pm Monday-Friday . 10 am to 6 pm [facility] Urgent [facility]) Get Driving Directions [phone]  [address] [place],  [place]  [address] . 8 am to 8 pm Monday-Friday . 8 am to 4 pm [facility] Urgent Care at [facility] Get Driving Directions [phone] [address], [address] [place], [place] [address] . 8 am to 8 pm Monday-Friday . 8 am to 4 pm [facility]gent Care at [facility] Get Driving Directions  [phone] [address] [place], [place] [address] . 8 am to 8 pm Monday-Friday . 8 am to 4 pm [facility] Urgent Care at [facility] Directions [phone] [address], [address] [place], [place] [address] . 8 am to 8 pm Monday-Friday . 8 am to 4 pm  Saturday-Sunday     Your MyChart E-visit questionnaire answers were reviewed by a board certified advanced clinical practitioner to complete your personal care plan based on your specific symptoms.  Thank you for using e-Visits.
